# Patient Record
Sex: Female | Born: 1955 | Race: White | Hispanic: No | Marital: Single | Smoking: Former smoker
Health system: Southern US, Community
[De-identification: ages and names within clinical notes are randomized; demographics above are authoritative.]

## PROBLEM LIST (undated history)

## (undated) HISTORY — PX: PULMONARY VALVE REPLACEMENT: SHX173

## (undated) HISTORY — PX: OTHER SURGICAL HISTORY: SHX169

## (undated) HISTORY — PX: AORTIC VALVE REPLACEMENT: SHX41

---

## 2007-10-13 ENCOUNTER — Ambulatory Visit: Payer: Self-pay | Admitting: Gynecology

## 2007-10-13 ENCOUNTER — Encounter: Payer: Self-pay | Admitting: Obstetrics & Gynecology

## 2009-04-11 ENCOUNTER — Encounter: Admission: RE | Admit: 2009-04-11 | Discharge: 2009-04-11 | Payer: Self-pay | Admitting: Obstetrics & Gynecology

## 2009-04-13 ENCOUNTER — Ambulatory Visit: Payer: Self-pay | Admitting: Obstetrics & Gynecology

## 2009-04-13 ENCOUNTER — Encounter: Payer: Self-pay | Admitting: Obstetrics & Gynecology

## 2010-11-26 ENCOUNTER — Encounter: Payer: Self-pay | Admitting: Obstetrics & Gynecology

## 2010-11-27 ENCOUNTER — Encounter: Payer: Self-pay | Admitting: Obstetrics & Gynecology

## 2011-03-20 NOTE — Assessment & Plan Note (Signed)
NAMEZELINA, JIMERSON NO.:  [id number]   MEDICAL RECORD NO.:  [id number]          PATIENT TYPE:  POB   LOCATION:  [facility] at [facility]         FACILITY:  [facility]   PHYSICIAN:  Allie Bossier, MD        DATE OF BIRTH:  Dec 14, 1955   DATE OF SERVICE:  10/13/2007                                  CLINIC NOTE   Lynna is a 55 year old divorced white gravida 2, para 2 who is here for  her annual exam.  Her last Pap smear was done in 2007.  She has no  complaints today.  She would like to have a prescription for Diflucan to  keep at home for a p.r.n. basis.  I have agreed to this.   PAST MEDICAL HISTORY:  None.   PAST SURGICAL HISTORY:  1. She had a Ross procedure with double valve replacement in 2001.  2. She had two cesarean sections.  3. Tubal ligation.   NO KNOWN DRUG ALLERGIES.  No latex allergies.   SOCIAL HISTORY:  Is positive for social alcohol, but no tobacco or  drugs.   FAMILY HISTORY:  Is significant for breast cancer in a 14 year old first  cousin who died at 67 years of age.  She denies family history of GYN or  colon malignancies.   REVIEW OF SYSTEMS:  She has been monogamous for the last 24-month. She  denies dyspareunia.  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   MEDICATIONS:  None.   PHYSICAL EXAM:  Height  5 feet, 3 inches; weight 136, blood pressure  128/80, pulse 74.  HEENT:  Normal.  HEART:  Regular rate rhythm.  LUNGS:  Clear to auscultation bilaterally.  Breast exam normal.  Abdominal exam benign, scaphoid.  EXTERNAL GENITALIA:  Normal.  No lesions.  Cervix nulliparous.  Normal  discharge.  Uterus normal size and shape, anteverted, mobile, adnexa  nontender.  No masses.   ASSESSMENT/PLAN:  Annual exam.  I have checked a Pap smear, recommend  self-breast and self vulvar exams monthly.  Recommended that she  continue her good health habits.      Allie Bossier, MD     MCD/MEDQ  D:  10/13/2007  T:  10/13/2007  Job:  [id number]

## 2011-03-20 NOTE — Assessment & Plan Note (Signed)
NAMETAMALYN, WADSWORTH NO.:  [id number]   MEDICAL RECORD NO.:  [id number]          PATIENT TYPE:  POB   LOCATION:  [facility] at [place]         FACILITY:  [facility]   PHYSICIAN:  Angelica Bossier, MD        DATE OF BIRTH:  20-Feb-1956   DATE OF SERVICE:  04/13/2009                                  CLINIC NOTE   IDENTIFICATION:  Angelica Rios is a 54 year old divorced white gravida 2, para  2, who is here for annual exam.  She has no complaints today and had her  mammogram done this past week.   PAST MEDICAL HISTORY:  None.   PAST SURGICAL HISTORY:  She had a aortic/pulmonic valve replacement  (Ross procedure) in 2001.  She had 2 C-sections and a tubal ligation.   REVIEW OF SYSTEMS:  She is abstinent currently.  She works at [employer], full-time and part-time in the [employer]  of [facility].  All other review of system questions  are negative   ALLERGIES:  No known drug allergies.  No latex allergies.   SOCIAL HISTORY:  She drinks socially, but denies tobacco or drug use.   FAMILY HISTORY:  Positive for breast cancer in a first cousin who is  deceased at age 65.  She denies family history of GYN or colon cancers.   PHYSICAL EXAMINATION:  VITAL SIGNS:  Height 5 feet 3 inches, weight 146,  blood pressure 107/70, pulse 75.  HEENT:  Normal.  HEART:  Regular rate and rhythm.  LUNGS:  Clear to auscultation bilaterally.  BREASTS:  She has fibrocystic densities bilateral in the upper outer  quadrants.  This is unchanged from previous exam.  ABDOMEN:  Benign, scaphoid.  No hepatosplenomegaly.  PELVIC:  External genitalia, no lesions.  Cervix, no lesions.  Normal  discharge.  Uterus is 6-week size, lumpy consistent with fibroids,  mobile, and nontender.  Adnexa, nontender.  No masses.   ASSESSMENT AND PLAN:  Annual exam.  I have checked Pap smear.  Recommended self-breast and self-vulvar exams.  She will follow up in a  year or sooner  as necessary.      Angelica Bossier, MD     MCD/MEDQ  D:  04/13/2009  T:  04/14/2009  Job:  [id number]

## 2011-08-28 ENCOUNTER — Other Ambulatory Visit: Payer: Self-pay | Admitting: Obstetrics & Gynecology

## 2011-08-28 DIAGNOSIS — Z1231 Encounter for screening mammogram for malignant neoplasm of breast: Secondary | ICD-10-CM

## 2011-09-18 ENCOUNTER — Encounter: Payer: Self-pay | Admitting: *Deleted

## 2011-09-18 ENCOUNTER — Ambulatory Visit (INDEPENDENT_AMBULATORY_CARE_PROVIDER_SITE_OTHER): Payer: BC Managed Care – PPO | Admitting: Obstetrics & Gynecology

## 2011-09-18 ENCOUNTER — Ambulatory Visit
Admission: RE | Admit: 2011-09-18 | Discharge: 2011-09-18 | Disposition: A | Discharge status: Home / Self Care | Payer: Self-pay | Source: Ambulatory Visit | Attending: Obstetrics & Gynecology | Admitting: Obstetrics & Gynecology

## 2011-09-18 ENCOUNTER — Encounter: Payer: Self-pay | Admitting: Obstetrics & Gynecology

## 2011-09-18 VITALS — BP 127/77 | HR 79 | Temp 98.4°F | Resp 16 | Ht 63.0 in | Wt 135.0 lb

## 2011-09-18 DIAGNOSIS — Z01419 Encounter for gynecological examination (general) (routine) without abnormal findings: Secondary | ICD-10-CM

## 2011-09-18 DIAGNOSIS — Z1272 Encounter for screening for malignant neoplasm of vagina: Secondary | ICD-10-CM

## 2011-09-18 DIAGNOSIS — Z113 Encounter for screening for infections with a predominantly sexual mode of transmission: Secondary | ICD-10-CM

## 2011-09-18 DIAGNOSIS — Z1231 Encounter for screening mammogram for malignant neoplasm of breast: Secondary | ICD-10-CM

## 2011-09-18 DIAGNOSIS — Z0000 Encounter for general adult medical examination without abnormal findings: Secondary | ICD-10-CM

## 2011-09-18 NOTE — Progress Notes (Signed)
Subjective:    Angelica Rios is a 55 y.o. female who presents for an annual exam. The patient has no complaints today. Her cycle interval is lengthening. The patient is sexually active. GYN screening history: last pap: was normal. The patient wears seatbelts: yes. The patient participates in regular exercise: yes. Has the patient ever been transfused or tattooed?: yes. The patient reports that there is not domestic violence in her life.   Menstrual History: OB History    Grav Para Term Preterm Abortions TAB SAB Ect Mult Living   2 2 2       2       Menarche age: 75 Patient's last menstrual period was 08/31/2011.    The following portions of the patient's history were reviewed and updated as appropriate: allergies, current medications, past family history, past medical history, past social history, past surgical history and problem list.  Review of Systems A comprehensive review of systems was negative.  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 She is sexually active and uses concoms. She denies vaginal atrophy and has only occasional hot flashes   Objective:    BP 127/77  Pulse 79  Temp(Src) 98.4 F (36.9 C) (Oral)  Resp 16  Ht 5\' 3"  (1.6 m)  Wt 135 lb (61.236 kg)  BMI 23.91 kg/m2  LMP 08/31/2011  General Appearance:    Alert, cooperative, no distress, appears stated age  Head:    Normocephalic, without obvious abnormality, atraumatic  Eyes:    PERRL, conjunctiva/corneas clear, EOM's intact, fundi    benign, both eyes  Ears:    Normal TM's and external ear canals, both ears  Nose:   Nares normal, septum midline, mucosa normal, no drainage    or sinus tenderness  Throat:   Lips, mucosa, and tongue normal; teeth and gums normal  Neck:   Supple, symmetrical, trachea midline, no adenopathy;    thyroid:  no enlargement/tenderness/nodules; no carotid   bruit or JVD  Back:     Symmetric, no curvature, ROM normal, no CVA tenderness  Lungs:     Clear to auscultation  bilaterally, respirations unlabored  Chest Wall:    No tenderness or deformity   Heart:    Regular rate and rhythm, S1 and S2 normal, 4/6 SEM  Breast Exam:    No tenderness, masses, or nipple abnormality  Abdomen:     Soft, non-tender, bowel sounds active all four quadrants,    no masses, no organomegaly  Genitalia:    Normal female without lesion, discharge or tenderness   NSSA, NT, no adnexal masses or tenderness  Extremities:   Extremities normal, atraumatic, no cyanosis or edema  Pulses:   2+ and symmetric all extremities  Skin:   Skin color, texture, turgor normal, no rashes or lesions  Lymph nodes:   Cervical, supraclavicular, and axillary nodes normal  Neurologic:   CNII-XII intact, normal strength, sensation and reflexes    throughout  .    Assessment:    Healthy female exam.    Plan:     Pap smear.  Mammogram was done today. I have recommended that she see her cardiologist.  She plans a colonoscopy soon.

## 2011-09-26 ENCOUNTER — Other Ambulatory Visit: Payer: Self-pay | Admitting: Obstetrics & Gynecology

## 2011-09-26 DIAGNOSIS — R928 Other abnormal and inconclusive findings on diagnostic imaging of breast: Secondary | ICD-10-CM

## 2011-10-10 ENCOUNTER — Ambulatory Visit
Admission: RE | Admit: 2011-10-10 | Discharge: 2011-10-10 | Disposition: A | Discharge status: Home / Self Care | Payer: BC Managed Care – PPO | Source: Ambulatory Visit | Attending: Obstetrics & Gynecology | Admitting: Obstetrics & Gynecology

## 2011-10-10 DIAGNOSIS — R928 Other abnormal and inconclusive findings on diagnostic imaging of breast: Secondary | ICD-10-CM

## 2012-04-02 ENCOUNTER — Telehealth: Payer: Self-pay | Admitting: *Deleted

## 2012-04-02 DIAGNOSIS — B373 Candidiasis of vulva and vagina: Secondary | ICD-10-CM

## 2012-04-02 MED ORDER — FLUCONAZOLE 150 MG PO TABS: 150.0000 mg | ORAL_TABLET | Freq: Once | ORAL | Status: AC

## 2012-04-02 NOTE — Telephone Encounter (Signed)
Pt called requesting a RX for Diflucan for a vaginal yeast.  Spoke with Dr Marice Potter who gave a verbal approval.

## 2012-08-18 ENCOUNTER — Other Ambulatory Visit: Payer: Self-pay | Admitting: Obstetrics & Gynecology

## 2012-08-18 DIAGNOSIS — Z1231 Encounter for screening mammogram for malignant neoplasm of breast: Secondary | ICD-10-CM

## 2012-09-30 ENCOUNTER — Encounter: Payer: Self-pay | Admitting: Obstetrics & Gynecology

## 2012-09-30 ENCOUNTER — Ambulatory Visit (HOSPITAL_BASED_OUTPATIENT_CLINIC_OR_DEPARTMENT_OTHER)
Admission: RE | Admit: 2012-09-30 | Discharge: 2012-09-30 | Disposition: A | Discharge status: Home / Self Care | Payer: BC Managed Care – PPO | Source: Ambulatory Visit | Attending: Obstetrics & Gynecology | Admitting: Obstetrics & Gynecology

## 2012-09-30 ENCOUNTER — Ambulatory Visit (INDEPENDENT_AMBULATORY_CARE_PROVIDER_SITE_OTHER): Payer: BC Managed Care – PPO | Admitting: Obstetrics & Gynecology

## 2012-09-30 ENCOUNTER — Ambulatory Visit: Payer: BC Managed Care – PPO

## 2012-09-30 VITALS — BP 115/74 | HR 89 | Temp 98.5°F | Resp 16 | Ht 63.0 in | Wt 147.0 lb

## 2012-09-30 DIAGNOSIS — Z01419 Encounter for gynecological examination (general) (routine) without abnormal findings: Secondary | ICD-10-CM

## 2012-09-30 DIAGNOSIS — Z1231 Encounter for screening mammogram for malignant neoplasm of breast: Secondary | ICD-10-CM | POA: Insufficient documentation

## 2012-09-30 DIAGNOSIS — Z1151 Encounter for screening for human papillomavirus (HPV): Secondary | ICD-10-CM

## 2012-09-30 DIAGNOSIS — Z124 Encounter for screening for malignant neoplasm of cervix: Secondary | ICD-10-CM

## 2012-09-30 DIAGNOSIS — Z0000 Encounter for general adult medical examination without abnormal findings: Secondary | ICD-10-CM

## 2012-09-30 MED ORDER — FLUCONAZOLE 150 MG PO TABS: 150.0000 mg | ORAL_TABLET | Freq: Once | ORAL | Status: DC

## 2012-09-30 NOTE — Progress Notes (Signed)
Subjective:    Angelica Rios is a 55 y.o. female who presents for an annual exam. The patient has no complaints today. The patient is not currently sexually active. GYN screening history: last pap: was normal. The patient wears seatbelts: yes. The patient participates in regular exercise: yes. Has the patient ever been transfused or tattooed?: no. The patient reports that there is not domestic violence in her life.   Menstrual History: OB History    Grav Para Term Preterm Abortions TAB SAB Ect Mult Living   2 2 2       2       Menarche age: 52 Patient's last menstrual period was 08/05/2012.    The following portions of the patient's history were reviewed and updated as appropriate: allergies, current medications, past family history, past medical history, past social history, past surgical history and problem list.  Review of Systems A comprehensive review of systems was negative. 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 She recently ended a relationship with her boyfriend. She had a mammogram today and has already had her flu shot.  Objective:    BP 115/74  Pulse 89  Temp 98.5 F (36.9 C) (Oral)  Resp 16  Ht 5\' 3"  (1.6 m)  Wt 147 lb (66.679 kg)  BMI 26.04 kg/m2  LMP 08/05/2012  General Appearance:    Alert, cooperative, no distress, appears stated age  Head:    Normocephalic, without obvious abnormality, atraumatic  Eyes:    PERRL, conjunctiva/corneas clear, EOM's intact, fundi    benign, both eyes  Ears:    Normal TM's and external ear canals, both ears  Nose:   Nares normal, septum midline, mucosa normal, no drainage    or sinus tenderness  Throat:   Lips, mucosa, and tongue normal; teeth and gums normal  Neck:   Supple, symmetrical, trachea midline, no adenopathy;    thyroid:  no enlargement/tenderness/nodules; no carotid   bruit or JVD  Back:     Symmetric, no curvature, ROM normal, no CVA tenderness  Lungs:     Clear to auscultation bilaterally, respirations  unlabored  Chest Wall:    No tenderness or deformity   Heart:    Regular rate and rhythm, S1 and S2 normal, no murmur, rub   or gallop  Breast Exam:    No tenderness, masses, or nipple abnormality  Abdomen:     Soft, non-tender, bowel sounds active all four quadrants,    no masses, no organomegaly  Genitalia:    Normal female without lesion, discharge or tenderness, ULN size, mobile, NT, no adnexal masses or tenderness     Extremities:   Extremities normal, atraumatic, no cyanosis or edema  Pulses:   2+ and symmetric all extremities  Skin:   Skin color, texture, turgor normal, no rashes or lesions  Lymph nodes:   Cervical, supraclavicular, and axillary nodes normal  Neurologic:   CNII-XII intact, normal strength, sensation and reflexes    throughout  .    Assessment:    Healthy female exam.    Plan:     Thin prep Pap smear.

## 2013-08-26 ENCOUNTER — Other Ambulatory Visit: Payer: Self-pay | Admitting: Obstetrics & Gynecology

## 2013-08-26 DIAGNOSIS — Z1231 Encounter for screening mammogram for malignant neoplasm of breast: Secondary | ICD-10-CM

## 2013-10-08 ENCOUNTER — Encounter: Payer: Self-pay | Admitting: Obstetrics & Gynecology

## 2013-10-08 ENCOUNTER — Ambulatory Visit (INDEPENDENT_AMBULATORY_CARE_PROVIDER_SITE_OTHER): Payer: BC Managed Care – PPO

## 2013-10-08 ENCOUNTER — Ambulatory Visit (INDEPENDENT_AMBULATORY_CARE_PROVIDER_SITE_OTHER): Payer: BC Managed Care – PPO | Admitting: Obstetrics & Gynecology

## 2013-10-08 VITALS — BP 108/68 | HR 81 | Resp 16 | Ht 63.0 in | Wt 149.0 lb

## 2013-10-08 DIAGNOSIS — Z1231 Encounter for screening mammogram for malignant neoplasm of breast: Secondary | ICD-10-CM

## 2013-10-08 DIAGNOSIS — Z1151 Encounter for screening for human papillomavirus (HPV): Secondary | ICD-10-CM

## 2013-10-08 DIAGNOSIS — Z23 Encounter for immunization: Secondary | ICD-10-CM

## 2013-10-08 DIAGNOSIS — Z0000 Encounter for general adult medical examination without abnormal findings: Secondary | ICD-10-CM

## 2013-10-08 DIAGNOSIS — Z124 Encounter for screening for malignant neoplasm of cervix: Secondary | ICD-10-CM

## 2013-10-08 DIAGNOSIS — Z01419 Encounter for gynecological examination (general) (routine) without abnormal findings: Secondary | ICD-10-CM

## 2013-10-08 NOTE — Progress Notes (Signed)
Patient ID: Angelica Rios, female   DOB: 1956-07-26, 57 y.o.   MRN: [id number] Subjective:    Angelica Rios is a 57 y.o. female who presents for an annual exam. The patient has no complaints today.  The patient is sexually active. GYN screening history: last pap: was normal. The patient wears seatbelts: yes. The patient participates in regular exercise: yes. Has the patient ever been transfused or tattooed?: not asked. The patient reports that there is not domestic violence in her life.   Menstrual History: OB History   Grav Para Term Preterm Abortions TAB SAB Ect Mult Living   2 2 2       2       Menarche age: 37 Coitarche: 73  Patient's last menstrual period was 06/05/2013.    The following portions of the patient's history were reviewed and updated as appropriate: allergies, current medications, past family history, past medical history, past social history, past surgical history and problem list.  Review of Systems A comprehensive review of systems was negative.  She has a new partner for the last 7 months and denies dyspareunia. She declines STI testing. She had about 7 periods last year and hot flashes seem to have resolved.   Objective:    BP 108/68  Pulse 81  Resp 16  Ht 5\' 3"  (1.6 m)  Wt 149 lb (67.586 kg)  BMI 26.40 kg/m2  LMP 06/05/2013  General Appearance:    Alert, cooperative, no distress, appears stated age  Head:    Normocephalic, without obvious abnormality, atraumatic  Eyes:    PERRL, conjunctiva/corneas clear, EOM's intact, fundi    benign, both eyes  Ears:    Normal TM's and external ear canals, both ears  Nose:   Nares normal, septum midline, mucosa normal, no drainage    or sinus tenderness  Throat:   Lips, mucosa, and tongue normal; teeth and gums normal  Neck:   Supple, symmetrical, trachea midline, no adenopathy;    thyroid:  no enlargement/tenderness/nodules; no carotid   bruit or JVD  Back:     Symmetric, no curvature, ROM normal, no CVA tenderness   Lungs:     Clear to auscultation bilaterally, respirations unlabored  Chest Wall:    No tenderness or deformity   Heart:    Regular rate and rhythm, S1 and S2 normal, no murmur, rub   or gallop  Breast Exam:    No tenderness, masses, or nipple abnormality, She has her usual fibrocystic changes throughout both breasts, left greater than right  Abdomen:     Soft, non-tender, bowel sounds active all four quadrants,    no masses, no organomegaly  Genitalia:    Normal female without lesion, discharge or tenderness, NSSA, mobile, NT, no adnexal masses     Extremities:   Extremities normal, atraumatic, no cyanosis or edema  Pulses:   2+ and symmetric all extremities  Skin:   Skin color, texture, turgor normal, no rashes or lesions  Lymph nodes:   Cervical, supraclavicular, and axillary nodes normal  Neurologic:   CNII-XII intact, normal strength, sensation and reflexes    throughout  .    Assessment:    Healthy female exam.    Plan:     Breast self exam technique reviewed and patient encouraged to perform self-exam monthly. Mammogram. Thin prep Pap smear. with cotesting Flu vaccine today

## 2014-08-31 ENCOUNTER — Other Ambulatory Visit: Payer: Self-pay | Admitting: Obstetrics & Gynecology

## 2014-08-31 DIAGNOSIS — Z1231 Encounter for screening mammogram for malignant neoplasm of breast: Secondary | ICD-10-CM

## 2014-09-06 ENCOUNTER — Encounter: Payer: Self-pay | Admitting: Obstetrics & Gynecology

## 2014-10-13 ENCOUNTER — Encounter: Payer: Self-pay | Admitting: Obstetrics & Gynecology

## 2014-10-13 ENCOUNTER — Ambulatory Visit (INDEPENDENT_AMBULATORY_CARE_PROVIDER_SITE_OTHER): Payer: BC Managed Care – PPO

## 2014-10-13 ENCOUNTER — Ambulatory Visit (INDEPENDENT_AMBULATORY_CARE_PROVIDER_SITE_OTHER): Payer: BC Managed Care – PPO | Admitting: Obstetrics & Gynecology

## 2014-10-13 VITALS — BP 138/81 | HR 82 | Resp 16 | Ht 63.0 in | Wt 152.0 lb

## 2014-10-13 DIAGNOSIS — Z0000 Encounter for general adult medical examination without abnormal findings: Secondary | ICD-10-CM

## 2014-10-13 DIAGNOSIS — Z113 Encounter for screening for infections with a predominantly sexual mode of transmission: Secondary | ICD-10-CM | POA: Diagnosis not present

## 2014-10-13 DIAGNOSIS — Z23 Encounter for immunization: Secondary | ICD-10-CM | POA: Diagnosis not present

## 2014-10-13 DIAGNOSIS — Z01419 Encounter for gynecological examination (general) (routine) without abnormal findings: Secondary | ICD-10-CM | POA: Diagnosis not present

## 2014-10-13 DIAGNOSIS — Z124 Encounter for screening for malignant neoplasm of cervix: Secondary | ICD-10-CM | POA: Diagnosis not present

## 2014-10-13 DIAGNOSIS — Z1151 Encounter for screening for human papillomavirus (HPV): Secondary | ICD-10-CM | POA: Diagnosis not present

## 2014-10-13 DIAGNOSIS — Z1231 Encounter for screening mammogram for malignant neoplasm of breast: Secondary | ICD-10-CM

## 2014-10-13 MED ORDER — FLUCONAZOLE 150 MG PO TABS: 150.0000 mg | ORAL_TABLET | Freq: Once | ORAL | Status: DC

## 2014-10-13 NOTE — Progress Notes (Signed)
'  Subjective:    Angelica Rios is a 58 y.o. P2 (34 and 58 yo) female who presents for an annual exam. The patient has no complaints today. The patient is sexually active. GYN screening history: last pap: was normal. The patient wears seatbelts: yes. The patient participates in regular exercise: yes. Has the patient ever been transfused or tattooed?: no. The patient reports that there is not domestic violence in her life.   Menstrual History: OB History    Gravida Para Term Preterm AB TAB SAB Ectopic Multiple Living   2 2 2       2       Menarche age: [age]  No LMP recorded. Patient is not currently having periods (Reason: Perimenopausal).    The following portions of the patient's history were reviewed and updated as appropriate: allergies, current medications, past family history, past medical history, past social history, past surgical history and problem list.  Review of Systems Pertinent items are noted in HPI. Monogamous for 18 months, denies dyspareunia. Wants flu vaccine today. Getting mammogram today. Had colonscopy 8/14 (due in 10 years). Works [employer]. She had about 6 or 7 periods last year. She has had a BTL.   Objective:    BP 138/81 mmHg  Pulse 82  Resp 16  Ht 5\' 3"  (1.6 m)  Wt 152 lb (68.947 kg)  BMI 26.93 kg/m2  General Appearance:    Alert, cooperative, no distress, appears stated age  Head:    Normocephalic, without obvious abnormality, atraumatic  Eyes:    PERRL, conjunctiva/corneas clear, EOM's intact, fundi    benign, both eyes  Ears:    Normal TM's and external ear canals, both ears  Nose:   Nares normal, septum midline, mucosa normal, no drainage    or sinus tenderness  Throat:   Lips, mucosa, and tongue normal; teeth and gums normal  Neck:   Supple, symmetrical, trachea midline, no adenopathy;    thyroid:  no enlargement/tenderness/nodules; no carotid   bruit or JVD  Back:     Symmetric, no curvature, ROM normal, no CVA tenderness  Lungs:      Clear to auscultation bilaterally, respirations unlabored  Chest Wall:    No tenderness or deformity   Heart:    Regular rate and rhythm, S1 and S2 normal, no murmur, rub   or gallop  Breast Exam:    No tenderness, masses, or nipple abnormality  Abdomen:     Soft, non-tender, bowel sounds active all four quadrants,    no masses, no organomegaly  Genitalia:    Normal female without lesion, discharge or tenderness, ULN mobile, NT, normal adnexal exam     Extremities:   Extremities normal, atraumatic, no cyanosis or edema  Pulses:   2+ and symmetric all extremities  Skin:   Skin color, texture, turgor normal, no rashes or lesions  Lymph nodes:   Cervical, supraclavicular, and axillary nodes normal  Neurologic:   CNII-XII intact, normal strength, sensation and reflexes    throughout  .    Assessment:    Healthy female exam.    Plan:     Breast self exam technique reviewed and patient encouraged to perform self-exam monthly. Chlamydia specimen. GC specimen. Mammogram. Thin prep Pap smear. with cotesting

## 2014-10-14 ENCOUNTER — Ambulatory Visit: Payer: BC Managed Care – PPO | Admitting: Obstetrics & Gynecology

## 2014-10-14 ENCOUNTER — Ambulatory Visit: Payer: BC Managed Care – PPO

## 2014-10-14 LAB — CYTOLOGY - PAP

## 2015-09-05 ENCOUNTER — Other Ambulatory Visit (HOSPITAL_COMMUNITY): Payer: Self-pay | Admitting: Obstetrics & Gynecology

## 2015-09-05 DIAGNOSIS — Z1231 Encounter for screening mammogram for malignant neoplasm of breast: Secondary | ICD-10-CM

## 2015-10-19 ENCOUNTER — Ambulatory Visit (INDEPENDENT_AMBULATORY_CARE_PROVIDER_SITE_OTHER): Payer: 59 | Admitting: Obstetrics & Gynecology

## 2015-10-19 ENCOUNTER — Encounter: Payer: Self-pay | Admitting: Obstetrics & Gynecology

## 2015-10-19 ENCOUNTER — Ambulatory Visit (INDEPENDENT_AMBULATORY_CARE_PROVIDER_SITE_OTHER): Payer: 59

## 2015-10-19 VITALS — BP 129/80 | HR 82 | Resp 16 | Ht 63.0 in | Wt 152.0 lb

## 2015-10-19 DIAGNOSIS — Z23 Encounter for immunization: Secondary | ICD-10-CM

## 2015-10-19 DIAGNOSIS — Z01419 Encounter for gynecological examination (general) (routine) without abnormal findings: Secondary | ICD-10-CM | POA: Diagnosis not present

## 2015-10-19 DIAGNOSIS — R928 Other abnormal and inconclusive findings on diagnostic imaging of breast: Secondary | ICD-10-CM | POA: Diagnosis not present

## 2015-10-19 DIAGNOSIS — Z124 Encounter for screening for malignant neoplasm of cervix: Secondary | ICD-10-CM | POA: Diagnosis not present

## 2015-10-19 DIAGNOSIS — Z1151 Encounter for screening for human papillomavirus (HPV): Secondary | ICD-10-CM

## 2015-10-19 DIAGNOSIS — Z1231 Encounter for screening mammogram for malignant neoplasm of breast: Secondary | ICD-10-CM

## 2015-10-19 DIAGNOSIS — Z0000 Encounter for general adult medical examination without abnormal findings: Secondary | ICD-10-CM

## 2015-10-19 MED ORDER — FLUCONAZOLE 150 MG PO TABS: 150.0000 mg | ORAL_TABLET | Freq: Every day | ORAL | Status: DC

## 2015-10-19 NOTE — Progress Notes (Signed)
Subjective:    Angelica Rios is a 59 y.o. DW female who presents for an annual exam. The patient has no complaints today. She has some burning with sex. The patient is currently sexually active. GYN screening history: last pap: was normal. The patient wears seatbelts: yes. The patient participates in regular exercise: yes. Has the patient ever been transfused or tattooed?: no. The patient reports that there is not domestic violence in her life.   Menstrual History: OB History    Gravida Para Term Preterm AB TAB SAB Ectopic Multiple Living   2 2 2       2       Menarche age: [age]  Patient's last menstrual period was 06/05/2013.    The following portions of the patient's history were reviewed and updated as appropriate: allergies, current medications, past family history, past medical history, past social history, past surgical history and problem list.  Review of Systems Pertinent items noted in HPI and remainder of comprehensive ROS otherwise negative. Flu vaccine today. She works for [employer] for 19 years.    Objective:    BP 129/80 mmHg  Pulse 82  Resp 16  Ht 5\' 3"  (1.6 m)  Wt 152 lb (68.947 kg)  BMI 26.93 kg/m2  LMP 06/05/2013  General Appearance:    Alert, cooperative, no distress, appears stated age  Head:    Normocephalic, without obvious abnormality, atraumatic  Eyes:    PERRL, conjunctiva/corneas clear, EOM's intact, fundi    benign, both eyes  Ears:    Normal TM's and external ear canals, both ears  Nose:   Nares normal, septum midline, mucosa normal, no drainage    or sinus tenderness  Throat:   Lips, mucosa, and tongue normal; teeth and gums normal  Neck:   Supple, symmetrical, trachea midline, no adenopathy;    thyroid:  no enlargement/tenderness/nodules; no carotid   bruit or JVD  Back:     Symmetric, no curvature, ROM normal, no CVA tenderness  Lungs:     Clear to auscultation bilaterally, respirations unlabored  Chest Wall:    No tenderness or deformity    Heart:    Regular rate and rhythm, S1 and S2 normal, no murmur, rub   or gallop  Breast Exam:    No tenderness, masses, or nipple abnormality  Abdomen:     Soft, non-tender, bowel sounds active all four quadrants,    no masses, no organomegaly  Genitalia:    Normal female without lesion, discharge or tenderness, moderate atrophy, NSSA, NT, mobile, normal adnexal exam     Extremities:   Extremities normal, atraumatic, no cyanosis or edema  Pulses:   2+ and symmetric all extremities  Skin:   Skin color, texture, turgor normal, no rashes or lesions  Lymph nodes:   Cervical, supraclavicular, and axillary nodes normal  Neurologic:   CNII-XII intact, normal strength, sensation and reflexes    throughout  .    Assessment:    Healthy female exam.    Plan:     Breast self exam technique reviewed and patient encouraged to perform self-exam monthly. Mammogram. Thin prep Pap smear. flu vaccine   Rec exercise more

## 2015-10-19 NOTE — Addendum Note (Signed)
Addended by: Arne ClevelandHUTCHINSON, MANDY J on: 10/19/2015 10:45 AM   Modules accepted: Orders

## 2015-10-20 LAB — CYTOLOGY - PAP

## 2015-10-24 ENCOUNTER — Other Ambulatory Visit (HOSPITAL_COMMUNITY): Payer: Self-pay | Admitting: Obstetrics & Gynecology

## 2015-10-24 DIAGNOSIS — R928 Other abnormal and inconclusive findings on diagnostic imaging of breast: Secondary | ICD-10-CM

## 2015-11-03 ENCOUNTER — Ambulatory Visit
Admission: RE | Admit: 2015-11-03 | Discharge: 2015-11-03 | Disposition: A | Discharge status: Home / Self Care | Payer: 59 | Source: Ambulatory Visit | Attending: Obstetrics & Gynecology | Admitting: Obstetrics & Gynecology

## 2015-11-03 DIAGNOSIS — R928 Other abnormal and inconclusive findings on diagnostic imaging of breast: Secondary | ICD-10-CM

## 2016-04-24 ENCOUNTER — Ambulatory Visit (INDEPENDENT_AMBULATORY_CARE_PROVIDER_SITE_OTHER): Payer: 59 | Admitting: Obstetrics & Gynecology

## 2016-04-24 ENCOUNTER — Encounter: Payer: Self-pay | Admitting: Obstetrics & Gynecology

## 2016-04-24 VITALS — BP 145/85 | HR 88 | Resp 16 | Ht 63.0 in | Wt 152.0 lb

## 2016-04-24 DIAGNOSIS — N898 Other specified noninflammatory disorders of vagina: Secondary | ICD-10-CM | POA: Diagnosis not present

## 2016-04-24 DIAGNOSIS — N949 Unspecified condition associated with female genital organs and menstrual cycle: Secondary | ICD-10-CM | POA: Diagnosis not present

## 2016-04-24 NOTE — Progress Notes (Signed)
   Subjective:    Patient ID: Angelica Rios, female    DOB: [birth year], 58 y.o.   MRN: [id number]  HPI 60 yo lady here with vaginal burning with IC since 9/16. She has tested negative for vaginitis in the past. She has tried Astroglide and vaseline with no help.   Review of Systems     Objective:   Physical Exam [id number] Breathing, conversing, and ambulating normally Vulva and vagina with severe atrophy Scant amount of thin white discharge       Assessment & Plan:  Vaginal burning with IC- I suspect that this is due to sever v v a- will give samples and script for estrace Wet prep sent

## 2016-04-25 ENCOUNTER — Telehealth: Payer: Self-pay | Admitting: *Deleted

## 2016-04-25 DIAGNOSIS — B9689 Other specified bacterial agents as the cause of diseases classified elsewhere: Secondary | ICD-10-CM

## 2016-04-25 DIAGNOSIS — N760 Acute vaginitis: Principal | ICD-10-CM

## 2016-04-25 LAB — WET PREP FOR TRICH, YEAST, CLUE
TRICH WET PREP: NONE SEEN
Yeast Wet Prep HPF POC: NONE SEEN

## 2016-04-25 MED ORDER — METRONIDAZOLE 500 MG PO TABS: 500.0000 mg | ORAL_TABLET | Freq: Two times a day (BID) | ORAL | Status: DC

## 2016-04-25 NOTE — Telephone Encounter (Signed)
Pt notified of wet prep results and Flagyl 500 mg BID for 7 days per Dr Marice Potterove.

## 2016-09-25 ENCOUNTER — Ambulatory Visit: Payer: 59

## 2016-09-25 ENCOUNTER — Other Ambulatory Visit (HOSPITAL_COMMUNITY): Payer: Self-pay | Admitting: Obstetrics & Gynecology

## 2016-09-25 DIAGNOSIS — Z1231 Encounter for screening mammogram for malignant neoplasm of breast: Secondary | ICD-10-CM

## 2016-12-11 ENCOUNTER — Encounter: Payer: Self-pay | Admitting: Obstetrics & Gynecology

## 2016-12-11 ENCOUNTER — Ambulatory Visit (INDEPENDENT_AMBULATORY_CARE_PROVIDER_SITE_OTHER): Payer: 59

## 2016-12-11 ENCOUNTER — Ambulatory Visit (INDEPENDENT_AMBULATORY_CARE_PROVIDER_SITE_OTHER): Payer: 59 | Admitting: Obstetrics & Gynecology

## 2016-12-11 VITALS — BP 133/80 | HR 84 | Resp 16 | Ht 63.0 in | Wt 154.0 lb

## 2016-12-11 DIAGNOSIS — Z0000 Encounter for general adult medical examination without abnormal findings: Secondary | ICD-10-CM

## 2016-12-11 DIAGNOSIS — Z01419 Encounter for gynecological examination (general) (routine) without abnormal findings: Secondary | ICD-10-CM | POA: Diagnosis not present

## 2016-12-11 DIAGNOSIS — Z1231 Encounter for screening mammogram for malignant neoplasm of breast: Secondary | ICD-10-CM | POA: Diagnosis not present

## 2016-12-11 NOTE — Progress Notes (Signed)
Subjective:    Angelica Rios is a 61 y.o. SW female who presents for an annual exam. The patient has no complaints today. The patient is not currently sexually active. GYN screening history: last pap: was normal. The patient wears seatbelts: yes. The patient participates in regular exercise: yes. Has the patient ever been transfused or tattooed?: no. The patient reports that there is not domestic violence in her life.   Menstrual History: OB History    Gravida Para Term Preterm AB Living   2 2 2     2    SAB TAB Ectopic Multiple Live Births                   Patient's last menstrual period was 06/05/2013.    The following portions of the patient's history were reviewed and updated as appropriate: allergies, current medications, past family history, past medical history, past social history, past surgical history and problem list.  Review of Systems Pertinent items are noted in HPI.   Mammogram today Flu vaccine UTD   Objective:    BP 133/80   Pulse 84   Resp 16   Ht 5\' 3"  (1.6 m)   Wt 154 lb (69.9 kg)   LMP 06/05/2013   BMI 27.28 kg/m   General Appearance:    Alert, cooperative, no distress, appears stated age  Head:    Normocephalic, without obvious abnormality, atraumatic  Eyes:    PERRL, conjunctiva/corneas clear, EOM's intact, fundi    benign, both eyes  Ears:    Normal TM's and external ear canals, both ears  Nose:   Nares normal, septum midline, mucosa normal, no drainage    or sinus tenderness  Throat:   Lips, mucosa, and tongue normal; teeth and gums normal  Neck:   Supple, symmetrical, trachea midline, no adenopathy;    thyroid:  no enlargement/tenderness/nodules; no carotid   bruit or JVD  Back:     Symmetric, no curvature, ROM normal, no CVA tenderness  Lungs:     Clear to auscultation bilaterally, respirations unlabored  Chest Wall:    No tenderness or deformity   Heart:    Regular rate and rhythm, S1 and S2 normal, no murmur, rub   or gallop  Breast Exam:     No tenderness, masses, or nipple abnormality  Abdomen:     Soft, non-tender, bowel sounds active all four quadrants,    no masses, no organomegaly  Genitalia:    Normal female without lesion, discharge or tenderness, NSSR, NT, no adnexal tenderness or masses     Extremities:   Extremities normal, atraumatic, no cyanosis or edema  Pulses:   2+ and symmetric all extremities  Skin:   Skin color, texture, turgor normal, no rashes or lesions  Lymph nodes:   Cervical, supraclavicular, and axillary nodes normal  Neurologic:   CNII-XII intact, normal strength, sensation and reflexes    throughout  .    Assessment:    Healthy female exam.    Plan:     Thin prep Pap smear. with cotesting

## 2016-12-12 ENCOUNTER — Ambulatory Visit: Payer: 59

## 2016-12-13 LAB — CYTOLOGY - PAP
ADEQUACY: ABSENT
Diagnosis: NEGATIVE
HPV: NOT DETECTED

## 2018-01-21 ENCOUNTER — Other Ambulatory Visit (HOSPITAL_COMMUNITY): Payer: Self-pay | Admitting: Obstetrics & Gynecology

## 2018-01-21 DIAGNOSIS — Z1231 Encounter for screening mammogram for malignant neoplasm of breast: Secondary | ICD-10-CM

## 2018-02-26 ENCOUNTER — Telehealth: Payer: Self-pay | Admitting: Obstetrics & Gynecology

## 2018-02-26 ENCOUNTER — Ambulatory Visit (INDEPENDENT_AMBULATORY_CARE_PROVIDER_SITE_OTHER): Payer: Managed Care, Other (non HMO)

## 2018-02-26 ENCOUNTER — Ambulatory Visit (INDEPENDENT_AMBULATORY_CARE_PROVIDER_SITE_OTHER): Payer: Managed Care, Other (non HMO) | Admitting: Obstetrics & Gynecology

## 2018-02-26 ENCOUNTER — Encounter: Payer: Self-pay | Admitting: Obstetrics & Gynecology

## 2018-02-26 VITALS — BP 145/87 | HR 87 | Ht 63.0 in | Wt 154.0 lb

## 2018-02-26 DIAGNOSIS — Z1151 Encounter for screening for human papillomavirus (HPV): Secondary | ICD-10-CM | POA: Diagnosis not present

## 2018-02-26 DIAGNOSIS — Z1231 Encounter for screening mammogram for malignant neoplasm of breast: Secondary | ICD-10-CM

## 2018-02-26 DIAGNOSIS — Z01419 Encounter for gynecological examination (general) (routine) without abnormal findings: Secondary | ICD-10-CM

## 2018-02-26 DIAGNOSIS — Z124 Encounter for screening for malignant neoplasm of cervix: Secondary | ICD-10-CM | POA: Diagnosis not present

## 2018-02-26 NOTE — Telephone Encounter (Signed)
Pt states her PCP is [facility] [place].

## 2018-02-26 NOTE — Progress Notes (Signed)
Subjective:    Angelica Rios is a 62 y.o. female who presents for an annual exam. The patient has no complaints today. The patient is not currently sexually active. GYN screening history: last pap: was normal. The patient wears seatbelts: yes. The patient participates in regular exercise: yes. (hikes all the time) Has the patient ever been transfused or tattooed?: no. The patient reports that there is not domestic violence in her life.   Menstrual History: OB History    Gravida  2   Para  2   Term  2   Preterm      AB      Living  2     SAB      TAB      Ectopic      Multiple      Live Births              Menarche age: 27 Patient's last menstrual period was 06/05/2013.    The following portions of the patient's history were reviewed and updated as appropriate: allergies, current medications, past family history, past medical history, past social history, past surgical history and problem list.  Review of Systems Pertinent items are noted in HPI.   FH- + breast cancer in her first cousin, died at 83 years old, possible ovarian cancer in daughter (NO CHEMO), no colon cancer Works for [employer] for 22 years Mammogram today Fam med in [place] Colonoscopy UTD   Objective:    BP (!) 145/87   Pulse 87   Ht 5\' 3"  (1.6 m)   Wt 154 lb (69.9 kg)   LMP 06/05/2013   BMI 27.28 kg/m   General Appearance:    Alert, cooperative, no distress, appears stated age  Head:    Normocephalic, without obvious abnormality, atraumatic  Eyes:    PERRL, conjunctiva/corneas clear, EOM's intact, fundi    benign, both eyes  Ears:    Normal TM's and external ear canals, both ears  Nose:   Nares normal, septum midline, mucosa normal, no drainage    or sinus tenderness  Throat:   Lips, mucosa, and tongue normal; teeth and gums normal  Neck:   Supple, symmetrical, trachea midline, no adenopathy;    thyroid:  no enlargement/tenderness/nodules; no carotid   bruit or JVD  Back:      Symmetric, no curvature, ROM normal, no CVA tenderness  Lungs:     Clear to auscultation bilaterally, respirations unlabored  Chest Wall:    No tenderness or deformity   Heart:    Regular rate and rhythm, S1 and S2 normal, no murmur, rub   or gallop  Breast Exam:    No tenderness, masses, or nipple abnormality  Abdomen:     Soft, non-tender, bowel sounds active all four quadrants,    no masses, no organomegaly  Genitalia:    Normal female without lesion, discharge or tenderness, moderate atrophy, stenotic cervix, normal size and shape, anteverted, mobile, non-tender, normal adnexal exam      Extremities:   Extremities normal, atraumatic, no cyanosis or edema  Pulses:   2+ and symmetric all extremities  Skin:   Skin color, texture, turgor normal, no rashes or lesions  Lymph nodes:   Cervical, supraclavicular, and axillary nodes normal  Neurologic:   CNII-XII intact, normal strength, sensation and reflexes    throughout  .    Assessment:    Healthy female exam.    Plan:     Thin prep Pap smear. with  cotesting

## 2018-02-27 LAB — CYTOLOGY - PAP
Diagnosis: NEGATIVE
HPV (WINDOPATH): NOT DETECTED

## 2019-04-07 ENCOUNTER — Other Ambulatory Visit: Payer: Self-pay | Admitting: Obstetrics & Gynecology

## 2019-04-07 DIAGNOSIS — Z1231 Encounter for screening mammogram for malignant neoplasm of breast: Secondary | ICD-10-CM

## 2019-04-30 ENCOUNTER — Encounter: Payer: Self-pay | Admitting: Obstetrics & Gynecology

## 2019-04-30 ENCOUNTER — Ambulatory Visit (INDEPENDENT_AMBULATORY_CARE_PROVIDER_SITE_OTHER): Payer: BC Managed Care – PPO | Admitting: Obstetrics & Gynecology

## 2019-04-30 ENCOUNTER — Ambulatory Visit (INDEPENDENT_AMBULATORY_CARE_PROVIDER_SITE_OTHER): Payer: BC Managed Care – PPO

## 2019-04-30 ENCOUNTER — Other Ambulatory Visit: Payer: Self-pay

## 2019-04-30 VITALS — BP 144/82 | HR 93 | Wt 162.0 lb

## 2019-04-30 DIAGNOSIS — Z01419 Encounter for gynecological examination (general) (routine) without abnormal findings: Secondary | ICD-10-CM

## 2019-04-30 DIAGNOSIS — Z124 Encounter for screening for malignant neoplasm of cervix: Secondary | ICD-10-CM | POA: Diagnosis not present

## 2019-04-30 DIAGNOSIS — Z1151 Encounter for screening for human papillomavirus (HPV): Secondary | ICD-10-CM

## 2019-04-30 DIAGNOSIS — Z1231 Encounter for screening mammogram for malignant neoplasm of breast: Secondary | ICD-10-CM

## 2019-04-30 NOTE — Progress Notes (Signed)
Subjective:    Angelica Rios is a 63 y.o. engaged P2 ([age], 61 yo kids, 2 grands) female who presents for an annual exam. The patient has no complaints today. She is using lubricant for sex. The patient is sexually active. GYN screening history: last pap: was normal. The patient wears seatbelts: yes. The patient participates in regular exercise: yes. Has the patient ever been transfused or tattooed?: no. The patient reports that there is not domestic violence in her life.   Menstrual History: OB History    Gravida  2   Para  2   Term  2   Preterm      AB      Living  2     SAB      TAB      Ectopic      Multiple      Live Births              Menarche age: [age] Patient's last menstrual period was 06/05/2013.    The following portions of the patient's history were reviewed and updated as appropriate: allergies, current medications, past family history, past medical history, past social history, past surgical history and problem list.  Review of Systems Pertinent items are noted in HPI.   FH- + breast cancer in first cousing died at 61 yo and possible ovarian cancer in her daughter (no chemo), no colon cancer Works for [employer] for 23 years Colonoscopy UTD Had a recent normal mammogram   Objective:    BP (!) 144/82   Pulse 93   Wt 162 lb (73.5 kg)   LMP 06/05/2013   BMI 28.70 kg/m   General Appearance:    Alert, cooperative, no distress, appears stated age  Head:    Normocephalic, without obvious abnormality, atraumatic  Eyes:    PERRL, conjunctiva/corneas clear, EOM's intact, fundi    benign, both eyes  Ears:    Normal TM's and external ear canals, both ears  Nose:   Nares normal, septum midline, mucosa normal, no drainage    or sinus tenderness  Throat:   Lips, mucosa, and tongue normal; teeth and gums normal  Neck:   Supple, symmetrical, trachea midline, no adenopathy;    thyroid:  no enlargement/tenderness/nodules; no carotid   bruit or JVD  Back:      Symmetric, no curvature, ROM normal, no CVA tenderness  Lungs:     Clear to auscultation bilaterally, respirations unlabored  Chest Wall:    No tenderness or deformity   Heart:    Regular rate and rhythm, S1 and S2 normal, no murmur, rub   or gallop  Breast Exam:    No tenderness, masses, or nipple abnormality  Abdomen:     Soft, non-tender, bowel sounds active all four quadrants,    no masses, no organomegaly  Genitalia:    Normal female without lesion, discharge or tenderness, moderate vulvo vaginal atrophy, stenotic cervix, normal size and shape, anteverted, mobile, non-tender, non-palpable adnexa      Extremities:   Extremities normal, atraumatic, no cyanosis or edema  Pulses:   2+ and symmetric all extremities  Skin:   Skin color, texture, turgor normal, no rashes or lesions  Lymph nodes:   Cervical, supraclavicular, and axillary nodes normal  Neurologic:   CNII-XII intact, normal strength, sensation and reflexes    throughout  .    Assessment:    Healthy female exam.    Plan:     Thin prep Pap smear. with  cotesting Offered genetic testing

## 2019-04-30 NOTE — Progress Notes (Signed)
Last pap- 02/26/18- negative Last mammogram- 04/30/19- normal

## 2019-05-05 LAB — CYTOLOGY - PAP
Adequacy: ABSENT
Diagnosis: NEGATIVE
HPV: NOT DETECTED

## 2020-07-04 IMAGING — MG DIGITAL SCREENING BILATERAL MAMMOGRAM WITH TOMO AND CAD
8 series · 8 of 24 positions shown · non-contrast
Comparison: Previous exam(s).

CLINICAL DATA: Screening.

EXAM:
DIGITAL SCREENING BILATERAL MAMMOGRAM WITH TOMO AND CAD

[R CC synth-2D]
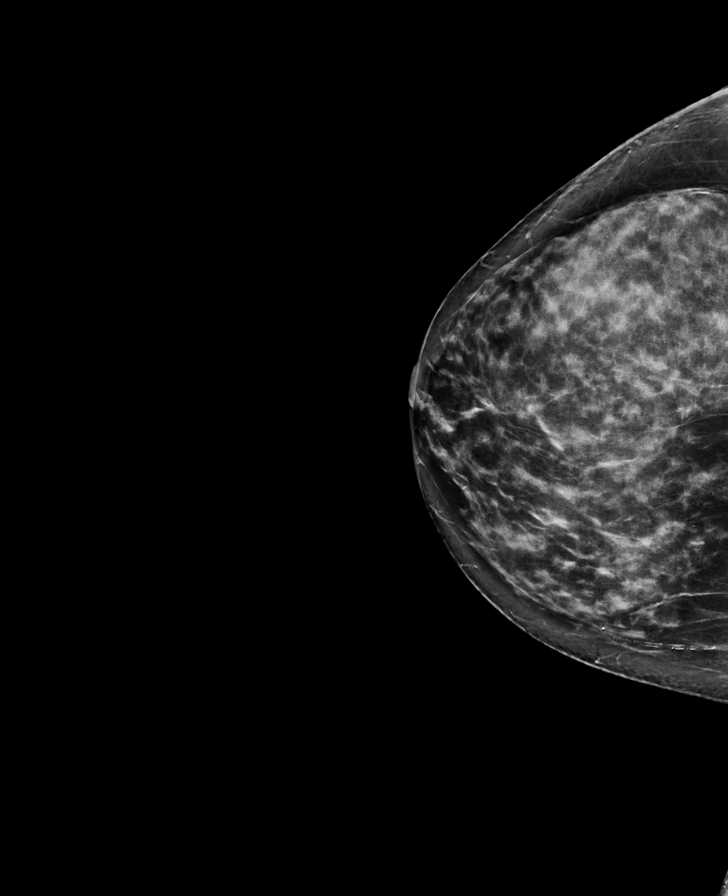

[L CC synth-2D]
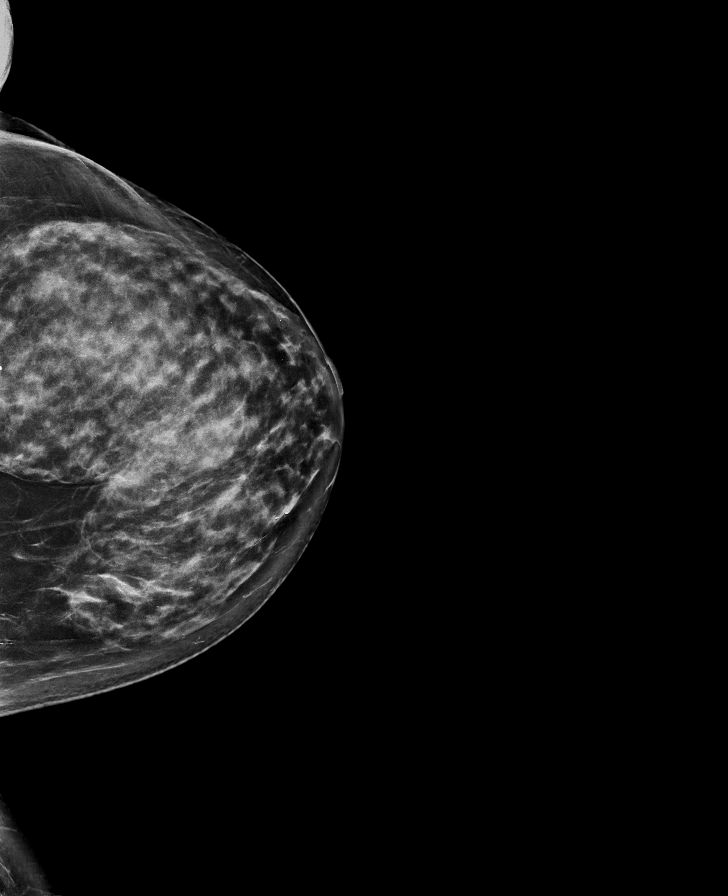

[R MLO synth-2D]
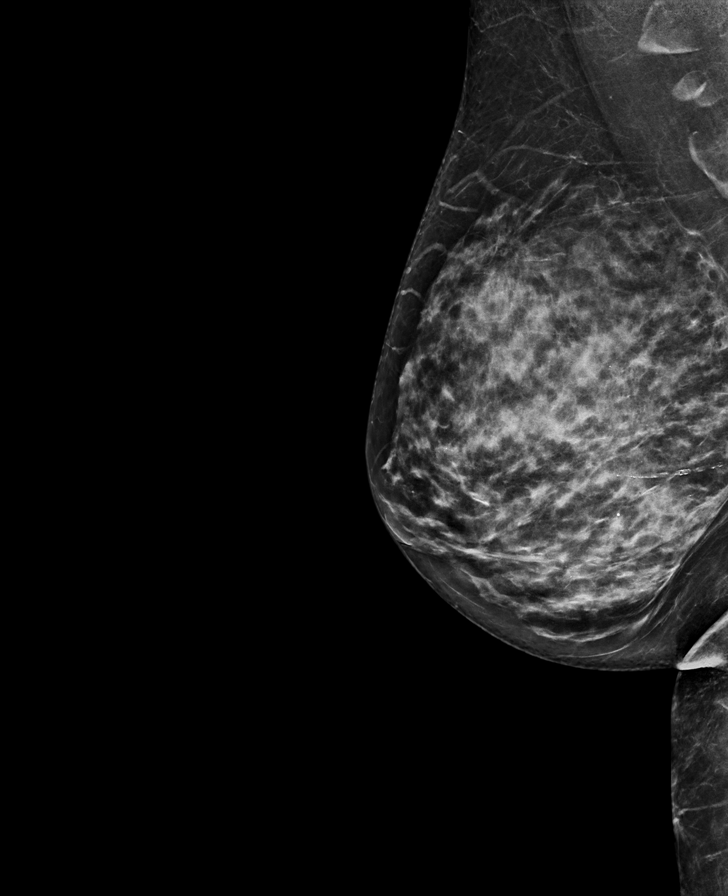

[L MLO synth-2D]
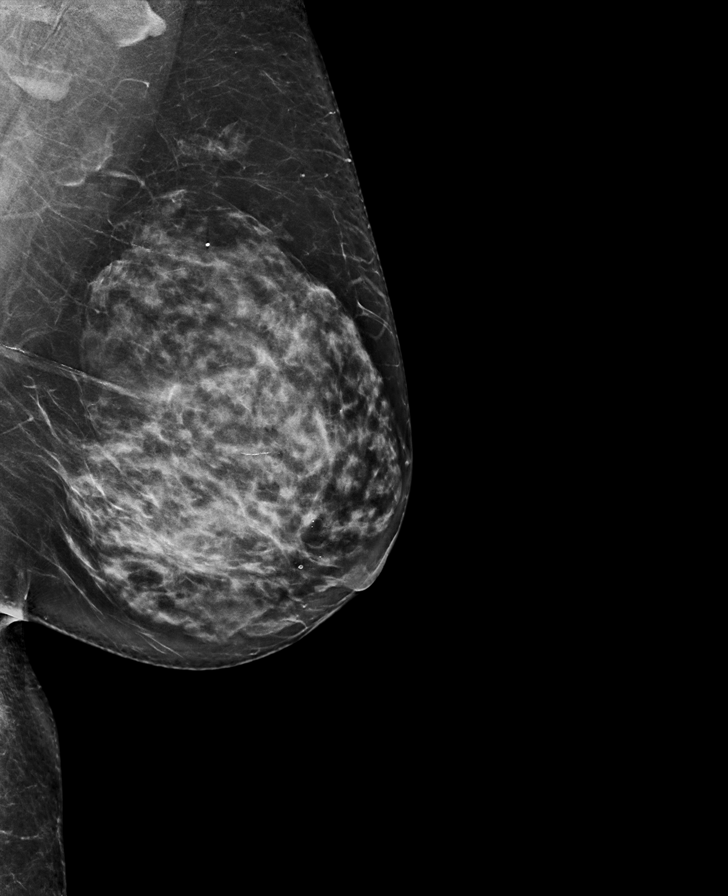

[R CC tomo · tomo slice 40/79.0]
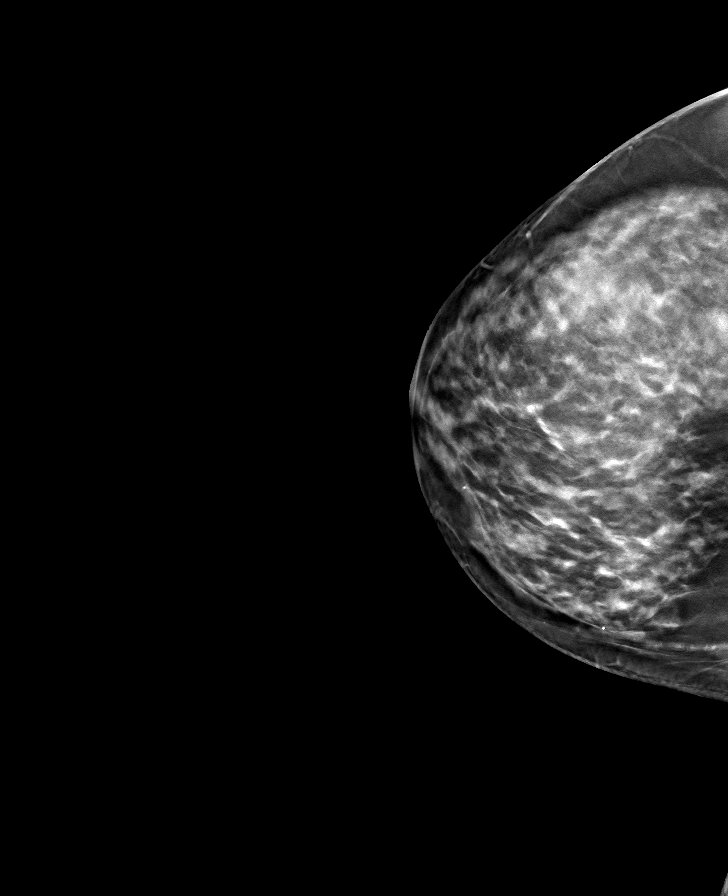

[L MLO tomo · tomo slice 44/87.0]
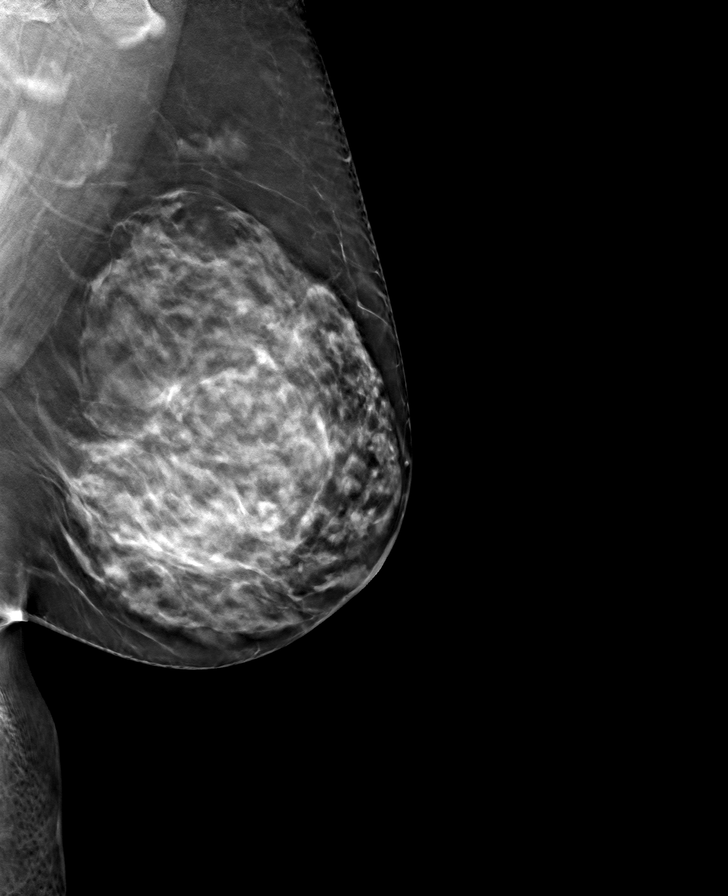

[R MLO tomo · tomo slice 39/78.0]
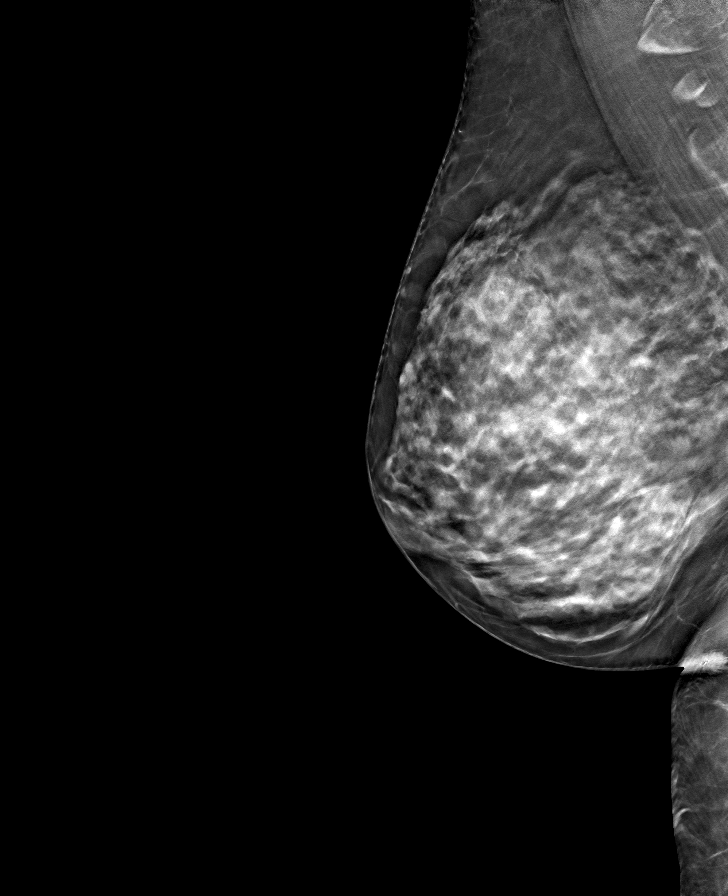

[L CC tomo · tomo slice 45/89.0]
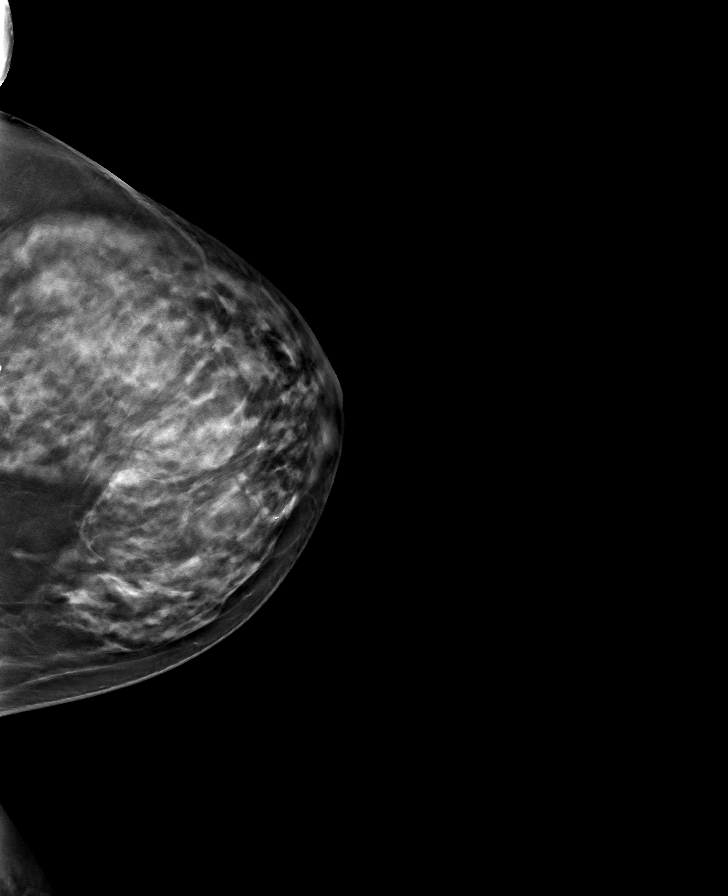

[8 of 24 positions shown; findings below may reference images not displayed]

ACR Breast Density Category d: The breast tissue is extremely dense,
which lowers the sensitivity of mammography
FINDINGS: There are no findings suspicious for malignancy. Images were
processed with CAD.
IMPRESSION: No mammographic evidence of malignancy. A result letter of this
screening mammogram will be mailed directly to the patient.

RECOMMENDATION:
Screening mammogram in one year. (Code:WO-[id number])

BI-RADS CATEGORY  1: Negative.
